# Patient Record
Sex: Female | Born: 1990 | Race: Black or African American | Hispanic: No | Marital: Single | Smoking: Never smoker
Health system: Southern US, Community
[De-identification: ages and names within clinical notes are randomized; demographics above are authoritative.]

## PROBLEM LIST (undated history)

## (undated) DIAGNOSIS — M1990 Unspecified osteoarthritis, unspecified site: Secondary | ICD-10-CM

## (undated) DIAGNOSIS — G5600 Carpal tunnel syndrome, unspecified upper limb: Secondary | ICD-10-CM

---

## 2012-03-25 ENCOUNTER — Encounter (HOSPITAL_COMMUNITY): Payer: Self-pay

## 2012-03-25 ENCOUNTER — Emergency Department (HOSPITAL_COMMUNITY): Payer: BC Managed Care – PPO

## 2012-03-25 ENCOUNTER — Emergency Department (HOSPITAL_COMMUNITY)
Admission: EM | Admit: 2012-03-25 | Discharge: 2012-03-25 | Disposition: A | Discharge status: Home Health | Payer: BC Managed Care – PPO | Attending: Emergency Medicine | Admitting: Emergency Medicine

## 2012-03-25 DIAGNOSIS — Z8739 Personal history of other diseases of the musculoskeletal system and connective tissue: Secondary | ICD-10-CM | POA: Insufficient documentation

## 2012-03-25 DIAGNOSIS — Z3202 Encounter for pregnancy test, result negative: Secondary | ICD-10-CM | POA: Insufficient documentation

## 2012-03-25 DIAGNOSIS — R109 Unspecified abdominal pain: Secondary | ICD-10-CM | POA: Insufficient documentation

## 2012-03-25 DIAGNOSIS — R112 Nausea with vomiting, unspecified: Secondary | ICD-10-CM | POA: Insufficient documentation

## 2012-03-25 HISTORY — DX: Unspecified osteoarthritis, unspecified site: M19.90

## 2012-03-25 LAB — URINALYSIS, ROUTINE W REFLEX MICROSCOPIC
Glucose, UA: NEGATIVE mg/dL
Hgb urine dipstick: NEGATIVE
Protein, ur: NEGATIVE mg/dL
Specific Gravity, Urine: 1.011 (ref 1.005–1.030)

## 2012-03-25 LAB — POCT PREGNANCY, URINE: Preg Test, Ur: NEGATIVE

## 2012-03-25 LAB — URINE MICROSCOPIC-ADD ON

## 2012-03-25 MED ORDER — IBUPROFEN 400 MG PO TABS
400.0000 mg | ORAL_TABLET | Freq: Once | ORAL | Status: AC
  Administered 2012-03-25: 400 mg via ORAL
  Filled 2012-03-25: qty 1

## 2012-03-25 MED ORDER — ONDANSETRON HCL 4 MG PO TABS: 4.0000 mg | ORAL_TABLET | Freq: Four times a day (QID) | ORAL | Status: AC

## 2012-03-25 MED ORDER — SODIUM CHLORIDE 0.9 % IV BOLUS (SEPSIS)
1000.0000 mL | Freq: Once | INTRAVENOUS | Status: AC
  Administered 2012-03-25: 1000 mL via INTRAVENOUS

## 2012-03-25 MED ORDER — ONDANSETRON 4 MG PO TBDP
4.0000 mg | ORAL_TABLET | Freq: Once | ORAL | Status: AC
  Administered 2012-03-25: 4 mg via ORAL
  Filled 2012-03-25: qty 1

## 2012-03-25 NOTE — ED Notes (Signed)
After eating spaghetti , pt. Began having abdominal pain with epigastric pain.  Became nauseated and vomited X3.

## 2012-03-25 NOTE — ED Notes (Signed)
Pt discharged to home with family. NAD.  

## 2012-03-25 NOTE — ED Provider Notes (Signed)
History     CSN: [id number]  Arrival date & time 03/25/12  4098   First MD Initiated Contact with Patient 03/25/12 [phone]      Chief Complaint  Patient presents with  . Abdominal Pain    (Consider location/radiation/quality/duration/timing/severity/associated sxs/prior treatment) HPI Comments: Patient is a 22 year old female who presents for a cramping pain, primarily in her epigastric region, as well as 3 episodes of nonbilious, nonbloody emesis. Patient states she felt bloated with generalized abdominal cramping after eating spaghetti for dinner last night. Patient then was awoken from sleep at 3 AM with an episode of emesis. Patient was able to go back to sleep and woke at 7 AM, with 2 more episodes of emesis. Patient states that lying on her stomach makes her abdominal pain feel better and that the pain and nausea is aggravated with walking. Patient has been able to keep down by mouth fluids since her last episode of emesis at 7 AM, stating she had a hot chocolate. Patient also states this helped her abdominal discomfort slightly. Patient denies headache, lightheadedness, dizziness, difficulty swallowing, chest pain and shortness of breath, diarrhea, urinary symptoms, and numbness or tingling in her extremities. Patient states her last bowel movement was 4 days ago. She denies sick contacts, however she works as a Conservation officer, nature at [employer]. Patient was supposed to work this afternoon, but called out sick.  Patient is a 22 y.o. female presenting with abdominal pain. The history is provided by the patient. No language interpreter was used.  Abdominal Pain Associated symptoms: nausea and vomiting   Associated symptoms: no chest pain, no chills, no diarrhea, no dysuria, no fever, no hematuria and no shortness of breath     Past Medical History  Diagnosis Date  . Arthritis     History reviewed. No pertinent past surgical history.  No family history on file.  History  Substance Use  Topics  . Smoking status: Never Smoker   . Smokeless tobacco: Not on file  . Alcohol Use: Yes     Comment: occassional    OB History   Grav Para Term Preterm Abortions TAB SAB Ect Mult Living                  Review of Systems  Constitutional: Negative for fever and chills.  Eyes: Negative for visual disturbance.  Respiratory: Negative for chest tightness and shortness of breath.   Cardiovascular: Negative for chest pain.  Gastrointestinal: Positive for nausea, vomiting and abdominal pain. Negative for diarrhea and blood in stool.  Genitourinary: Negative for dysuria and hematuria.  Musculoskeletal: Negative for back pain.  Skin: Negative for color change.  Neurological: Negative for syncope, weakness and numbness.  All other systems reviewed and are negative.    Allergies  Review of patient's allergies indicates no known allergies.  Home Medications   Current Outpatient Rx  Name  Route  Sig  Dispense  Refill  . ondansetron (ZOFRAN) 4 MG tablet   Oral   Take 1 tablet (4 mg total) by mouth every 6 (six) hours.   12 tablet   0     BP 121/67  Pulse 98  Temp(Src) 98.1 F (36.7 C) (Oral)  SpO2 100%  LMP 03/18/2012  Physical Exam  Nursing note and vitals reviewed. Constitutional: She is oriented to person, place, and time. She appears well-developed and well-nourished. No distress.  HENT:  Head: Normocephalic and atraumatic.  Mouth/Throat: Oropharynx is clear and moist. No oropharyngeal exudate.  Eyes: Conjunctivae  are normal. Pupils are equal, round, and reactive to light. No scleral icterus.  Neck: Normal range of motion. Neck supple.  Cardiovascular: Normal rate, regular rhythm and normal heart sounds.   Pulmonary/Chest: Effort normal and breath sounds normal. No respiratory distress. She has no wheezes.  Abdominal: Soft. Bowel sounds are normal. She exhibits no distension and no mass. There is tenderness in the epigastric area and periumbilical area. There is  no rebound, no guarding, no CVA tenderness, no tenderness at McBurney's point and negative Murphy's sign.  Musculoskeletal: Normal range of motion.  Lymphadenopathy:    She has no cervical adenopathy.  Neurological: She is alert and oriented to person, place, and time.  Skin: Skin is warm and dry. She is not diaphoretic. No erythema.  Psychiatric: She has a normal mood and affect. Her behavior is normal.    ED Course  Procedures (including critical care time)  Labs Reviewed  URINALYSIS, ROUTINE W REFLEX MICROSCOPIC - Abnormal; Notable for the following:    APPearance CLOUDY (*)    Leukocytes, UA LARGE (*)    All other components within normal limits  URINE MICROSCOPIC-ADD ON - Abnormal; Notable for the following:    Squamous Epithelial / LPF MANY (*)    All other components within normal limits  POCT PREGNANCY, URINE   Dg Abd Acute W/chest  03/25/2012  *RADIOLOGY REPORT*  Clinical Data: 22 year old female with abdominal pain.  ACUTE ABDOMEN SERIES (ABDOMEN 2 VIEW & CHEST 1 VIEW)  Comparison: None  Findings: The cardiomediastinal silhouette is unremarkable. The lungs are clear. There is no evidence of airspace disease, pleural effusion or pneumothorax.  The bowel gas pattern is unremarkable. Stool and gas in the colon noted. No dilated bowel loops are present. There is no evidence of pneumoperitoneum or suspicious calcifications. The bony structures are unremarkable.  IMPRESSION: Unremarkable exam.  No evidence of acute abnormality.   Original Report Authenticated By: Harmon Pier, M.D.     1. Nausea and vomiting   2. Abdominal cramping     MDM  Patient is a 22 year old female who presents for nausea and vomiting without having a bowel movement in the last 4 days. Acute abdominal x-ray done to evaluate for possible bowel obstruction which was negative. Urine without evidence of dehydration. Patient treated in the ED with IV fluids and Zofran. Patient well-appearing with stable vital  signs; states she is feeling better and tolerating fluids by mouth. Patient will be discharged with a primary care followup and Zofran to take as needed for nausea and vomiting. Patient states comfort and understanding with this discharge plan. ED workup and management discussed with Dr. Blinda Leatherwood prior to discharge.     Antony Madura, PA-C 03/25/12 1321

## 2012-03-26 NOTE — ED Provider Notes (Signed)
Medical screening examination/treatment/procedure(s) were performed by non-physician practitioner and as supervising physician I was immediately available for consultation/collaboration.  Gilda Crease, MD 03/26/12 [phone]

## 2014-03-28 IMAGING — CR DG ABDOMEN ACUTE W/ 1V CHEST
3 series · 3 of 3 positions shown · non-contrast
Comparison: None

CLINICAL DATA: 21-year-old female with abdominal pain.

ACUTE ABDOMEN SERIES (ABDOMEN 2 VIEW & CHEST 1 VIEW)

[w chest pa]
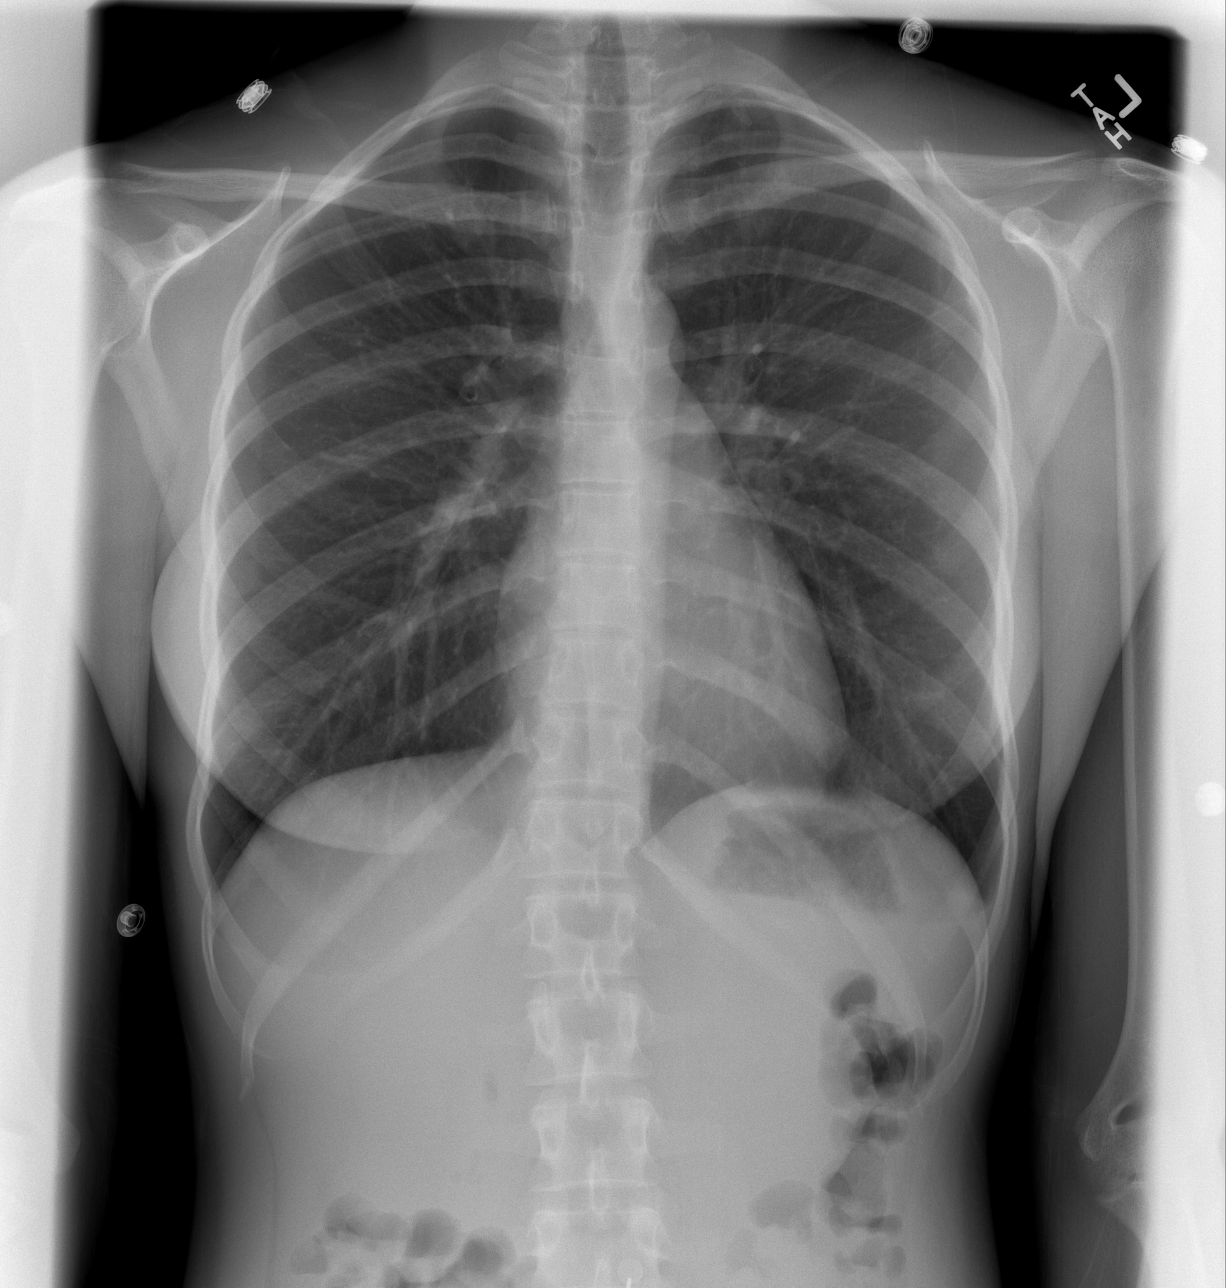

[w abdomen upright]
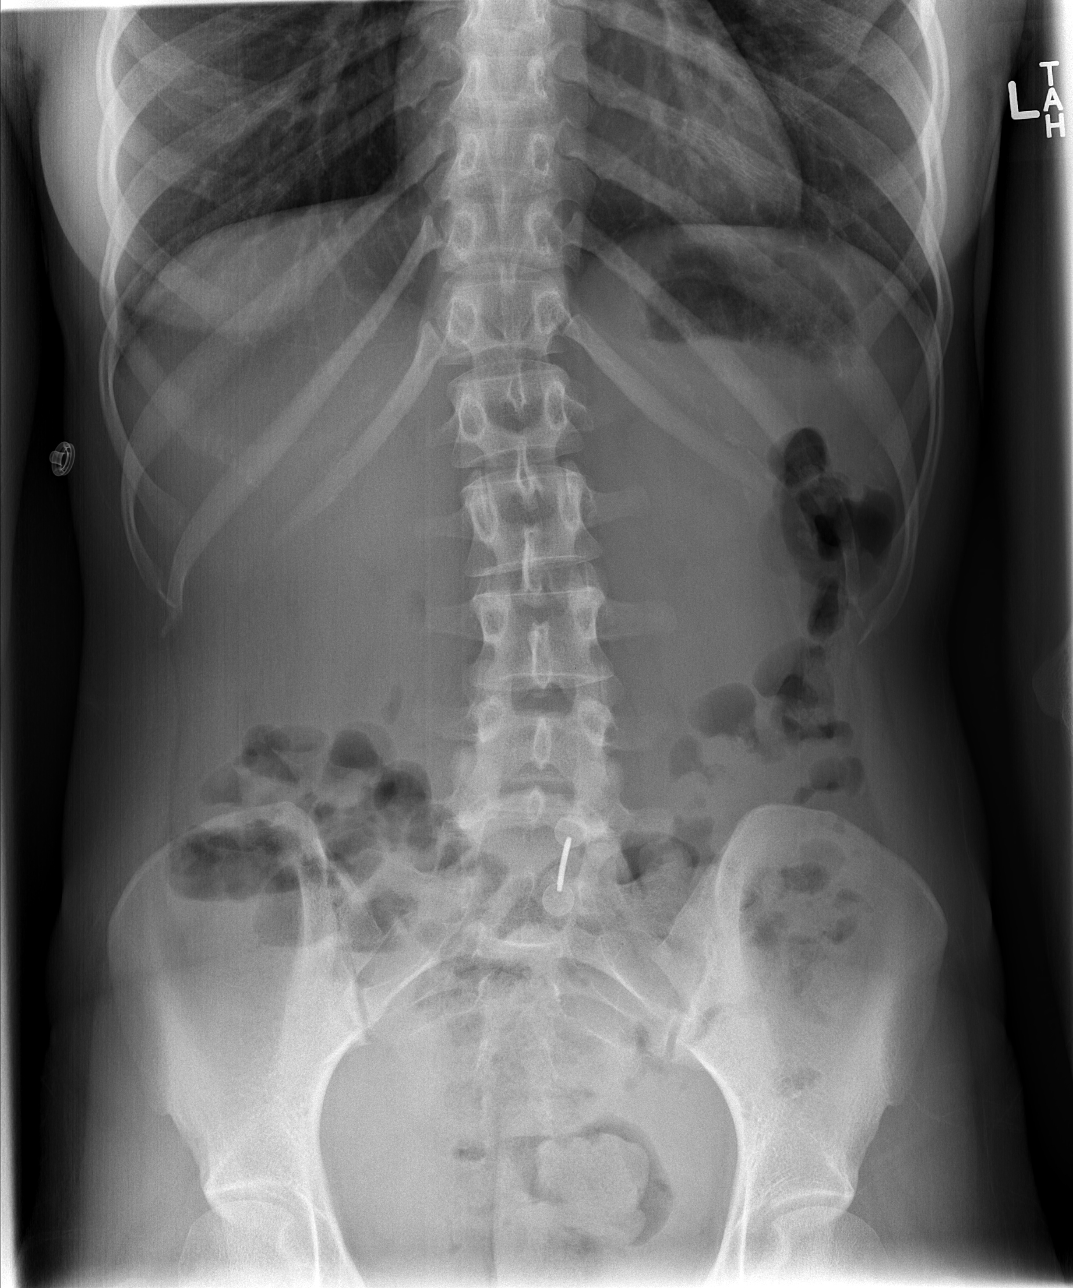

[t abdomen supine]
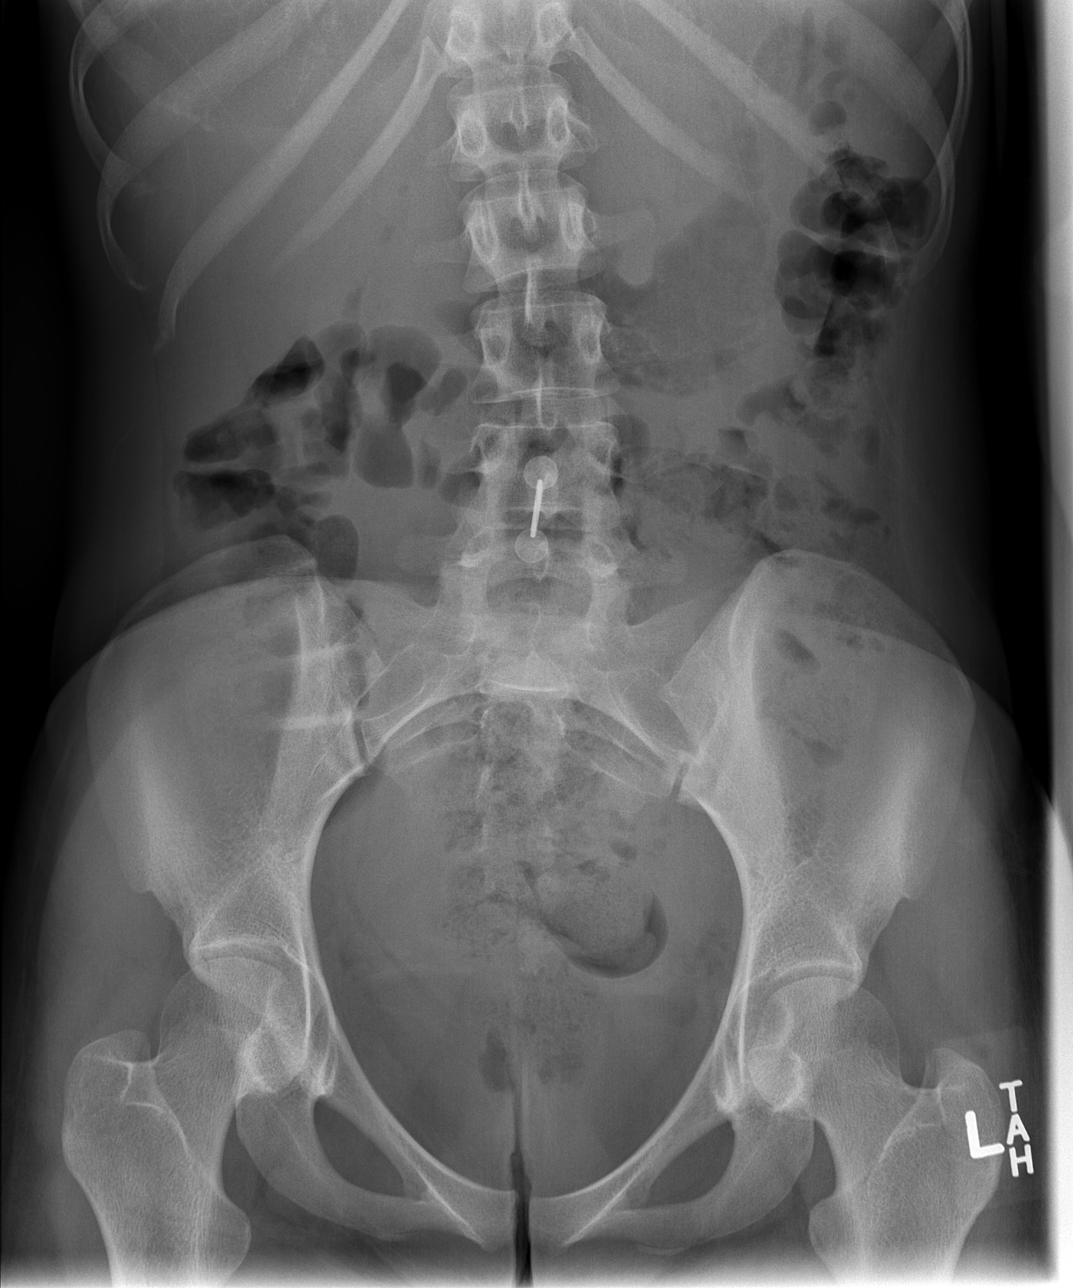

[3 of 3 positions shown; findings below may reference images not displayed]

FINDINGS: The cardiomediastinal silhouette is unremarkable.
The lungs are clear.
There is no evidence of airspace disease, pleural effusion or
pneumothorax.

The bowel gas pattern is unremarkable.
Stool and gas in the colon noted.
No dilated bowel loops are present.
There is no evidence of pneumoperitoneum or suspicious
calcifications.
The bony structures are unremarkable.
IMPRESSION: Unremarkable exam.  No evidence of acute abnormality.

## 2014-07-26 ENCOUNTER — Encounter (HOSPITAL_COMMUNITY): Payer: Self-pay | Admitting: Emergency Medicine

## 2014-07-26 ENCOUNTER — Emergency Department (HOSPITAL_COMMUNITY)
Admission: EM | Admit: 2014-07-26 | Discharge: 2014-07-26 | Disposition: A | Discharge status: Home / Self Care | Payer: BLUE CROSS/BLUE SHIELD | Attending: Emergency Medicine | Admitting: Emergency Medicine

## 2014-07-26 DIAGNOSIS — Y9389 Activity, other specified: Secondary | ICD-10-CM | POA: Diagnosis not present

## 2014-07-26 DIAGNOSIS — Y998 Other external cause status: Secondary | ICD-10-CM | POA: Insufficient documentation

## 2014-07-26 DIAGNOSIS — Z8739 Personal history of other diseases of the musculoskeletal system and connective tissue: Secondary | ICD-10-CM | POA: Diagnosis not present

## 2014-07-26 DIAGNOSIS — S29092A Other injury of muscle and tendon of back wall of thorax, initial encounter: Secondary | ICD-10-CM | POA: Diagnosis not present

## 2014-07-26 DIAGNOSIS — S0990XA Unspecified injury of head, initial encounter: Secondary | ICD-10-CM | POA: Diagnosis not present

## 2014-07-26 DIAGNOSIS — S199XXA Unspecified injury of neck, initial encounter: Secondary | ICD-10-CM | POA: Diagnosis not present

## 2014-07-26 DIAGNOSIS — Y92410 Unspecified street and highway as the place of occurrence of the external cause: Secondary | ICD-10-CM | POA: Insufficient documentation

## 2014-07-26 DIAGNOSIS — Z8669 Personal history of other diseases of the nervous system and sense organs: Secondary | ICD-10-CM | POA: Diagnosis not present

## 2014-07-26 DIAGNOSIS — Z3202 Encounter for pregnancy test, result negative: Secondary | ICD-10-CM | POA: Diagnosis not present

## 2014-07-26 HISTORY — DX: Carpal tunnel syndrome, unspecified upper limb: G56.00

## 2014-07-26 LAB — I-STAT BETA HCG BLOOD, ED (MC, WL, AP ONLY): I-stat hCG, quantitative: <5 m[IU]/mL (ref <5)

## 2014-07-26 MED ORDER — METOCLOPRAMIDE HCL 10 MG PO TABS
10.0000 mg | ORAL_TABLET | Freq: Once | ORAL | Status: AC
  Administered 2014-07-26: 10 mg via ORAL
  Filled 2014-07-26: qty 1

## 2014-07-26 MED ORDER — KETOROLAC TROMETHAMINE 60 MG/2ML IM SOLN
60.0000 mg | Freq: Once | INTRAMUSCULAR | Status: AC
  Administered 2014-07-26: 60 mg via INTRAMUSCULAR
  Filled 2014-07-26: qty 2

## 2014-07-26 MED ORDER — IBUPROFEN 800 MG PO TABS: 800.0000 mg | ORAL_TABLET | Freq: Three times a day (TID) | ORAL | Status: AC | PRN
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
## 2015-05-21 ENCOUNTER — Encounter: Payer: Self-pay | Admitting: Obstetrics and Gynecology
